# Patient Record
Sex: Male | Born: 1995 | Race: Black or African American | Hispanic: No | Marital: Single | Smoking: Current every day smoker
Health system: Southern US, Community
[De-identification: ages and names within clinical notes are randomized; demographics above are authoritative.]

## PROBLEM LIST (undated history)

## (undated) DIAGNOSIS — F32A Depression, unspecified: Secondary | ICD-10-CM

## (undated) DIAGNOSIS — F909 Attention-deficit hyperactivity disorder, unspecified type: Secondary | ICD-10-CM

## (undated) DIAGNOSIS — F419 Anxiety disorder, unspecified: Secondary | ICD-10-CM

## (undated) DIAGNOSIS — F329 Major depressive disorder, single episode, unspecified: Secondary | ICD-10-CM

## (undated) DIAGNOSIS — J45909 Unspecified asthma, uncomplicated: Secondary | ICD-10-CM

## (undated) DIAGNOSIS — F913 Oppositional defiant disorder: Secondary | ICD-10-CM

---
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
## 2016-04-21 ENCOUNTER — Encounter: Payer: Self-pay | Admitting: Neurology

## 2016-04-21 ENCOUNTER — Ambulatory Visit (INDEPENDENT_AMBULATORY_CARE_PROVIDER_SITE_OTHER): Payer: Medicaid Other | Admitting: Neurology

## 2016-04-21 VITALS — BP 107/77 | HR 79 | Ht 67.0 in | Wt 136.2 lb

## 2016-04-21 DIAGNOSIS — R519 Headache, unspecified: Secondary | ICD-10-CM | POA: Insufficient documentation

## 2016-04-21 DIAGNOSIS — G4489 Other headache syndrome: Secondary | ICD-10-CM

## 2016-04-21 DIAGNOSIS — M542 Cervicalgia: Secondary | ICD-10-CM | POA: Insufficient documentation

## 2016-04-21 DIAGNOSIS — M545 Low back pain, unspecified: Secondary | ICD-10-CM | POA: Insufficient documentation

## 2016-04-21 DIAGNOSIS — M5442 Lumbago with sciatica, left side: Secondary | ICD-10-CM

## 2016-04-21 DIAGNOSIS — G4486 Cervicogenic headache: Secondary | ICD-10-CM | POA: Insufficient documentation

## 2016-04-21 DIAGNOSIS — R51 Headache: Secondary | ICD-10-CM | POA: Diagnosis not present

## 2016-04-21 MED ORDER — AMITRIPTYLINE HCL 25 MG PO TABS
25.0000 mg | ORAL_TABLET | Freq: Every day | ORAL | Status: DC
Start: 2016-04-21 — End: 2016-10-02
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
## 2016-04-21 NOTE — Patient Instructions (Addendum)
Remember to drink plenty of fluid, eat healthy meals and do not skip any meals. Try to eat protein with a every meal and eat a healthy snack such as fruit or nuts in between meals. Try to keep a regular sleep-wake schedule and try to exercise daily, particularly in the form of walking, 20-30 minutes a day, if you can.   As far as your medications are concerned, I would like to suggest: Amitriptyline 25mg  at night. Start with 1/2 pill and increase to a whole pill. In 4-6 weeks if no improvement email and we can increase it to 50mg  at night.  As far as diagnostic testing: labs, physical therapy, headache journal  I would like to see you back in 3 months, sooner if we need to. Please call us with any interim questions, concerns, problems, updates or refill requests.   Our phone number is [phone]. We also have an after hours call service for urgent matters and there is a physician on-call for urgent questions. For any emergencies you know to call 911 or go to the nearest emergency room

## 2016-04-22 ENCOUNTER — Encounter: Payer: Self-pay | Admitting: Neurology

## 2016-05-19 ENCOUNTER — Ambulatory Visit: Payer: Medicaid Other | Attending: Neurology | Admitting: Rehabilitative and Restorative Service Providers"

## 2016-05-19 DIAGNOSIS — R293 Abnormal posture: Secondary | ICD-10-CM | POA: Diagnosis present

## 2016-05-19 DIAGNOSIS — M5442 Lumbago with sciatica, left side: Secondary | ICD-10-CM | POA: Insufficient documentation

## 2016-05-19 DIAGNOSIS — M542 Cervicalgia: Secondary | ICD-10-CM | POA: Insufficient documentation

## 2016-05-19 NOTE — Patient Instructions (Signed)
Bridge    Lie back, legs bent. Inhale, pressing hips up. Keeping ribs in, lengthen lower back. Exhale, rolling down along spine from top. Repeat _10___ times. Do __2-3__ sessions per day.  http://pm.exer.us/54   Copyright  VHI. All rights reserved.   Scapular Retraction (Prone)    Lie with arms at sides. Pinch shoulder blades together and raise arms a few inches from floor.  *Can lift shoulders off of floor. Repeat _10___ times per set. Do __2__ sets per session. Do __2__ sessions per day.  http://orth.exer.us/954   Copyright  VHI. All rights reserved.   Prone Plank (Eccentric)    On toes and elbows, pull abdomen in while stabilizing trunk. Slowly lower downward without arching back. __5_ reps per set, _2__ sets per day, __5_ days per week.  http://ecce.exer.us/242   Copyright  VHI. All rights reserved.  AROM: Lateral Neck Flexion    Slowly tilt head toward one shoulder, then the other. Hold each position __5__ seconds. Repeat ___5_ times per set. Do __1__ sets per session. Do __2__ sessions per day.  http://orth.exer.us/296   Copyright  VHI. All rights reserved.
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
## 2016-07-28 ENCOUNTER — Telehealth: Payer: Self-pay | Admitting: Adult Health

## 2016-07-28 ENCOUNTER — Other Ambulatory Visit: Payer: Self-pay

## 2016-07-28 ENCOUNTER — Ambulatory Visit: Payer: Medicaid Other | Admitting: Adult Health

## 2016-07-28 NOTE — Telephone Encounter (Signed)
Mom Neysa BonitoChristy called 1:19:45pm to advise, son is running late for 1:30pm appointment today w/NP, Megan. Mom advised, patient will still be seen up to 10 minutes past appointment time, after that will need to reschedule. Mom understands and agrees. Nurses Katrina and Victorino DikeJennifer advised.

## 2016-07-28 NOTE — Telephone Encounter (Signed)
Pt no-showed this afternoon's office visit. Called the same day as appt.

## 2016-07-29 ENCOUNTER — Encounter: Payer: Self-pay | Admitting: Adult Health

## 2016-10-02 ENCOUNTER — Encounter: Payer: Self-pay | Admitting: Adult Health

## 2016-10-02 ENCOUNTER — Ambulatory Visit (INDEPENDENT_AMBULATORY_CARE_PROVIDER_SITE_OTHER): Payer: Medicaid Other | Admitting: Adult Health

## 2016-10-02 VITALS — BP 118/77 | HR 86 | Wt 132.4 lb

## 2016-10-02 DIAGNOSIS — G44221 Chronic tension-type headache, intractable: Secondary | ICD-10-CM

## 2016-10-02 MED ORDER — AMITRIPTYLINE HCL 25 MG PO TABS: 25.0000 mg | ORAL_TABLET | Freq: Every day | ORAL | 11 refills | Status: DC

## 2016-10-02 NOTE — Patient Instructions (Signed)
Start Amitriptyline 1/2 tablet at bedtime for 1 week then increase to 1 tablet at bedtime If your symptoms worsen or you develop new symptoms please let us know.   Amitriptyline tablets What is this medicine? AMITRIPTYLINE (a mee TRIP ti leen) is used to treat depression. This medicine may be used for other purposes; ask your health care provider or pharmacist if you have questions. COMMON BRAND NAME(S): Elavil, Vanatrip What should I tell my health care provider before I take this medicine? They need to know if you have any of these conditions: -an alcohol problem -asthma, difficulty breathing -bipolar disorder or schizophrenia -difficulty passing urine, prostate trouble -glaucoma -heart disease or previous heart attack -liver disease -over active thyroid -seizures -thoughts or plans of suicide, a previous suicide attempt, or family history of suicide attempt -an unusual or allergic reaction to amitriptyline, other medicines, foods, dyes, or preservatives -pregnant or trying to get pregnant -breast-feeding How should I use this medicine? Take this medicine by mouth with a drink of water. Follow the directions on the prescription label. You can take the tablets with or without food. Take your medicine at regular intervals. Do not take it more often than directed. Do not stop taking this medicine suddenly except upon the advice of your doctor. Stopping this medicine too quickly may cause serious side effects or your condition may worsen. A special MedGuide will be given to you by the pharmacist with each prescription and refill. Be sure to read this information carefully each time. Talk to your pediatrician regarding the use of this medicine in children. Special care may be needed. Overdosage: If you think you have taken too much of this medicine contact a poison control center or emergency room at once. NOTE: This medicine is only for you. Do not share this medicine with others. What if  I miss a dose? If you miss a dose, take it as soon as you can. If it is almost time for your next dose, take only that dose. Do not take double or extra doses. What may interact with this medicine? Do not take this medicine with any of the following medications: -arsenic trioxide -certain medicines used to regulate abnormal heartbeat or to treat other heart conditions -cisapride -droperidol -halofantrine -linezolid -MAOIs like Carbex, Eldepryl, Marplan, Nardil, and Parnate -methylene blue -other medicines for mental depression -phenothiazines like perphenazine, thioridazine and chlorpromazine -pimozide -probucol -procarbazine -sparfloxacin -St. John's Wort -ziprasidone This medicine may also interact with the following medications: -atropine and related drugs like hyoscyamine, scopolamine, tolterodine and others -barbiturate medicines for inducing sleep or treating seizures, like phenobarbital -cimetidine -disulfiram -ethchlorvynol -thyroid hormones such as levothyroxine This list may not describe all possible interactions. Give your health care provider a list of all the medicines, herbs, non-prescription drugs, or dietary supplements you use. Also tell them if you smoke, drink alcohol, or use illegal drugs. Some items may interact with your medicine. What should I watch for while using this medicine? Tell your doctor if your symptoms do not get better or if they get worse. Visit your doctor or health care professional for regular checks on your progress. Because it may take several weeks to see the full effects of this medicine, it is important to continue your treatment as prescribed by your doctor. Patients and their families should watch out for new or worsening thoughts of suicide or depression. Also watch out for sudden changes in feelings such as feeling anxious, agitated, panicky, irritable, hostile, aggressive, impulsive, severely restless, overly excited  and hyperactive, or  not being able to sleep. If this happens, especially at the beginning of treatment or after a change in dose, call your health care professional. Bonita QuinYou may get drowsy or dizzy. Do not drive, use machinery, or do anything that needs mental alertness until you know how this medicine affects you. Do not stand or sit up quickly, especially if you are an older patient. This reduces the risk of dizzy or fainting spells. Alcohol may interfere with the effect of this medicine. Avoid alcoholic drinks. Do not treat yourself for coughs, colds, or allergies without asking your doctor or health care professional for advice. Some ingredients can increase possible side effects. Your mouth may get dry. Chewing sugarless gum or sucking hard candy, and drinking plenty of water will help. Contact your doctor if the problem does not go away or is severe. This medicine may cause dry eyes and blurred vision. If you wear contact lenses you may feel some discomfort. Lubricating drops may help. See your eye doctor if the problem does not go away or is severe. This medicine can cause constipation. Try to have a bowel movement at least every 2 to 3 days. If you do not have a bowel movement for 3 days, call your doctor or health care professional. This medicine can make you more sensitive to the sun. Keep out of the sun. If you cannot avoid being in the sun, wear protective clothing and use sunscreen. Do not use sun lamps or tanning beds/booths. What side effects may I notice from receiving this medicine? Side effects that you should report to your doctor or health care professional as soon as possible: -allergic reactions like skin rash, itching or hives, swelling of the face, lips, or tongue -anxious -breathing problems -changes in vision -confusion -elevated mood, decreased need for sleep, racing thoughts, impulsive behavior -eye pain -fast, irregular heartbeat -feeling faint or lightheaded, falls -feeling agitated, angry,  or irritable -fever with increased sweating -hallucination, loss of contact with reality -seizures -stiff muscles -suicidal thoughts or other mood changes -tingling, pain, or numbness in the feet or hands -trouble passing urine or change in the amount of urine -trouble sleeping -unusually weak or tired -vomiting -yellowing of the eyes or skin Side effects that usually do not require medical attention (report to your doctor or health care professional if they continue or are bothersome): -change in sex drive or performance -change in appetite or weight -constipation -dizziness -dry mouth -nausea -tired -tremors -upset stomach This list may not describe all possible side effects. Call your doctor for medical advice about side effects. You may report side effects to FDA at 1-800-FDA-1088. Where should I keep my medicine? Keep out of the reach of children. Store at room temperature between 20 and 25 degrees C (68 and 77 degrees F). Throw away any unused medicine after the expiration date. NOTE: This sheet is a summary. It may not cover all possible information. If you have questions about this medicine, talk to your doctor, pharmacist, or health care provider.  2017 Elsevier/Gold Standard (2016-03-21 12:14:15)
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
## 2016-10-10 ENCOUNTER — Encounter: Payer: Self-pay | Admitting: Rehabilitative and Restorative Service Providers"

## 2016-10-10 NOTE — Therapy (Signed)
[facility] [address] [place], [place], [address] Phone: [phone]   Fax:  [phone]  Patient Details  Name: LORIE MELICHAR MRN: [id number] Date of Birth: 05-18-1996 Referring Provider:  No ref. provider found  Encounter Date: last encounter 05/19/2016  PHYSICAL THERAPY DISCHARGE SUMMARY  Visits from Start of Care: 1  Current functional level related to goals / functional outcomes: See initial eval.   Remaining deficits: See initial eval--did not return   Education / Equipment: HEP.  Plan: Patient agrees to discharge.  Patient goals were not met. Patient is being discharged due to not returning since the last visit.  ?????        Thank you for the referral of this patient. Rudell Cobb, MPT   Verl Kitson 10/10/2016, 3:52 PM  [facility] [address] [place], [place], [address] Phone: [phone]   Fax:  [phone]

## 2016-10-17 NOTE — Progress Notes (Signed)
  Personally  participated in and made any corrections needed to history, physical, neuro exam,assessment and plan.   Quinisha Mould, MD [facility]  

## 2016-12-22 ENCOUNTER — Ambulatory Visit: Payer: Medicaid Other | Admitting: Adult Health

## 2016-12-23 ENCOUNTER — Encounter: Payer: Self-pay | Admitting: Adult Health

## 2017-02-18 ENCOUNTER — Ambulatory Visit (INDEPENDENT_AMBULATORY_CARE_PROVIDER_SITE_OTHER): Payer: Medicaid Other | Admitting: Adult Health

## 2017-02-18 ENCOUNTER — Encounter: Payer: Self-pay | Admitting: Adult Health

## 2017-02-18 ENCOUNTER — Encounter (INDEPENDENT_AMBULATORY_CARE_PROVIDER_SITE_OTHER): Payer: Self-pay

## 2017-02-18 VITALS — BP 123/72 | HR 67 | Wt 137.6 lb

## 2017-02-18 DIAGNOSIS — F4310 Post-traumatic stress disorder, unspecified: Secondary | ICD-10-CM

## 2017-02-18 DIAGNOSIS — G44209 Tension-type headache, unspecified, not intractable: Secondary | ICD-10-CM

## 2017-02-18 MED ORDER — AMITRIPTYLINE HCL 10 MG PO TABS: 30.0000 mg | ORAL_TABLET | Freq: Every day | ORAL | 11 refills | Status: DC

## 2017-02-18 NOTE — Patient Instructions (Addendum)
Increase Amitriptyline 30 mg at bedtime. Please Schedule appt with PCP to discuss possible PTSD If you have any thoughts of harming yourself please call 911 If your symptoms worsen or you develop new symptoms please let us know.   .Amitriptyline tablets What is this medicine? AMITRIPTYLINE (a mee TRIP ti leen) is used to treat depression. This medicine may be used for other purposes; ask your health care provider or pharmacist if you have questions. COMMON BRAND NAME(S): Elavil, Vanatrip What should I tell my health care provider before I take this medicine? They need to know if you have any of these conditions: -an alcohol problem -asthma, difficulty breathing -bipolar disorder or schizophrenia -difficulty passing urine, prostate trouble -glaucoma -heart disease or previous heart attack -liver disease -over active thyroid -seizures -thoughts or plans of suicide, a previous suicide attempt, or family history of suicide attempt -an unusual or allergic reaction to amitriptyline, other medicines, foods, dyes, or preservatives -pregnant or trying to get pregnant -breast-feeding How should I use this medicine? Take this medicine by mouth with a drink of water. Follow the directions on the prescription label. You can take the tablets with or without food. Take your medicine at regular intervals. Do not take it more often than directed. Do not stop taking this medicine suddenly except upon the advice of your doctor. Stopping this medicine too quickly may cause serious side effects or your condition may worsen. A special MedGuide will be given to you by the pharmacist with each prescription and refill. Be sure to read this information carefully each time. Talk to your pediatrician regarding the use of this medicine in children. Special care may be needed. Overdosage: If you think you have taken too much of this medicine contact a poison control center or emergency room at once. NOTE: This  medicine is only for you. Do not share this medicine with others. What if I miss a dose? If you miss a dose, take it as soon as you can. If it is almost time for your next dose, take only that dose. Do not take double or extra doses. What may interact with this medicine? Do not take this medicine with any of the following medications: -arsenic trioxide -certain medicines used to regulate abnormal heartbeat or to treat other heart conditions -cisapride -droperidol -halofantrine -linezolid -MAOIs like Carbex, Eldepryl, Marplan, Nardil, and Parnate -methylene blue -other medicines for mental depression -phenothiazines like perphenazine, thioridazine and chlorpromazine -pimozide -probucol -procarbazine -sparfloxacin -St. John's Wort -ziprasidone This medicine may also interact with the following medications: -atropine and related drugs like hyoscyamine, scopolamine, tolterodine and others -barbiturate medicines for inducing sleep or treating seizures, like phenobarbital -cimetidine -disulfiram -ethchlorvynol -thyroid hormones such as levothyroxine This list may not describe all possible interactions. Give your health care provider a list of all the medicines, herbs, non-prescription drugs, or dietary supplements you use. Also tell them if you smoke, drink alcohol, or use illegal drugs. Some items may interact with your medicine. What should I watch for while using this medicine? Tell your doctor if your symptoms do not get better or if they get worse. Visit your doctor or health care professional for regular checks on your progress. Because it may take several weeks to see the full effects of this medicine, it is important to continue your treatment as prescribed by your doctor. Patients and their families should watch out for new or worsening thoughts of suicide or depression. Also watch out for sudden changes in feelings such as feeling anxious,  agitated, panicky, irritable, hostile,  aggressive, impulsive, severely restless, overly excited and hyperactive, or not being able to sleep. If this happens, especially at the beginning of treatment or after a change in dose, call your health care professional. Bonita Quin may get drowsy or dizzy. Do not drive, use machinery, or do anything that needs mental alertness until you know how this medicine affects you. Do not stand or sit up quickly, especially if you are an older patient. This reduces the risk of dizzy or fainting spells. Alcohol may interfere with the effect of this medicine. Avoid alcoholic drinks. Do not treat yourself for coughs, colds, or allergies without asking your doctor or health care professional for advice. Some ingredients can increase possible side effects. Your mouth may get dry. Chewing sugarless gum or sucking hard candy, and drinking plenty of water will help. Contact your doctor if the problem does not go away or is severe. This medicine may cause dry eyes and blurred vision. If you wear contact lenses you may feel some discomfort. Lubricating drops may help. See your eye doctor if the problem does not go away or is severe. This medicine can cause constipation. Try to have a bowel movement at least every 2 to 3 days. If you do not have a bowel movement for 3 days, call your doctor or health care professional. This medicine can make you more sensitive to the sun. Keep out of the sun. If you cannot avoid being in the sun, wear protective clothing and use sunscreen. Do not use sun lamps or tanning beds/booths. What side effects may I notice from receiving this medicine? Side effects that you should report to your doctor or health care professional as soon as possible: -allergic reactions like skin rash, itching or hives, swelling of the face, lips, or tongue -anxious -breathing problems -changes in vision -confusion -elevated mood, decreased need for sleep, racing thoughts, impulsive behavior -eye pain -fast, irregular  heartbeat -feeling faint or lightheaded, falls -feeling agitated, angry, or irritable -fever with increased sweating -hallucination, loss of contact with reality -seizures -stiff muscles -suicidal thoughts or other mood changes -tingling, pain, or numbness in the feet or hands -trouble passing urine or change in the amount of urine -trouble sleeping -unusually weak or tired -vomiting -yellowing of the eyes or skin Side effects that usually do not require medical attention (report to your doctor or health care professional if they continue or are bothersome): -change in sex drive or performance -change in appetite or weight -constipation -dizziness -dry mouth -nausea -tired -tremors -upset stomach This list may not describe all possible side effects. Call your doctor for medical advice about side effects. You may report side effects to FDA at 1-800-FDA-1088. Where should I keep my medicine? Keep out of the reach of children. Store at room temperature between 20 and 25 degrees C (68 and 77 degrees F). Throw away any unused medicine after the expiration date. NOTE: This sheet is a summary. It may not cover all possible information. If you have questions about this medicine, talk to your doctor, pharmacist, or health care provider.  2018 Elsevier/Gold Standard (2016-03-21 12:14:15)
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
## 2017-03-12 NOTE — Progress Notes (Signed)
Personally  participated in, made any corrections needed, and agree with history, physical, neuro exam,assessment and plan as stated above.    Nemesio Castrillon, MD [facility] 

## 2017-08-24 ENCOUNTER — Telehealth: Payer: Self-pay | Admitting: *Deleted

## 2017-08-24 ENCOUNTER — Ambulatory Visit: Payer: Medicaid Other | Admitting: Neurology

## 2017-08-24 NOTE — Telephone Encounter (Signed)
Patient no showed appt on 08/24/2017 @ 14:30

## 2017-08-25 ENCOUNTER — Encounter: Payer: Self-pay | Admitting: Neurology

## 2017-09-25 ENCOUNTER — Encounter (HOSPITAL_COMMUNITY): Payer: Self-pay | Admitting: Emergency Medicine

## 2017-09-25 ENCOUNTER — Ambulatory Visit (HOSPITAL_COMMUNITY)
Admission: EM | Admit: 2017-09-25 | Discharge: 2017-09-25 | Disposition: A | Discharge status: Home / Self Care | Payer: Medicaid Other | Attending: Internal Medicine | Admitting: Internal Medicine

## 2017-09-25 DIAGNOSIS — R112 Nausea with vomiting, unspecified: Secondary | ICD-10-CM | POA: Diagnosis not present

## 2017-09-25 DIAGNOSIS — Z7689 Persons encountering health services in other specified circumstances: Secondary | ICD-10-CM | POA: Diagnosis not present

## 2017-09-25 DIAGNOSIS — Z0289 Encounter for other administrative examinations: Secondary | ICD-10-CM | POA: Diagnosis not present
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
## 2017-09-25 NOTE — ED Triage Notes (Signed)
Pt here for note to return to work after having GI sx x 2 days that are now resolved

## 2017-09-25 NOTE — Discharge Instructions (Signed)
Given your history and exam, ok to return to work. Still keep up good hand hygiene.

## 2019-12-07 ENCOUNTER — Ambulatory Visit (HOSPITAL_COMMUNITY)
Admission: EM | Admit: 2019-12-07 | Discharge: 2019-12-07 | Disposition: A | Discharge status: Home / Self Care | Payer: Self-pay | Attending: Family Medicine | Admitting: Family Medicine

## 2019-12-07 ENCOUNTER — Other Ambulatory Visit: Payer: Self-pay

## 2019-12-07 ENCOUNTER — Encounter (HOSPITAL_COMMUNITY): Payer: Self-pay | Admitting: Emergency Medicine

## 2019-12-07 DIAGNOSIS — Z202 Contact with and (suspected) exposure to infections with a predominantly sexual mode of transmission: Secondary | ICD-10-CM

## 2019-12-07 DIAGNOSIS — R030 Elevated blood-pressure reading, without diagnosis of hypertension: Secondary | ICD-10-CM | POA: Insufficient documentation

## 2019-12-07 DIAGNOSIS — R369 Urethral discharge, unspecified: Secondary | ICD-10-CM | POA: Insufficient documentation

## 2019-12-07 MED ORDER — CEFTRIAXONE SODIUM 500 MG IJ SOLR
500.0000 mg | Freq: Once | INTRAMUSCULAR | Status: AC
  Administered 2019-12-07: 500 mg via INTRAMUSCULAR

## 2019-12-07 MED ORDER — CEFTRIAXONE SODIUM 500 MG IJ SOLR
INTRAMUSCULAR | Status: AC
  Filled 2019-12-07: qty 500

## 2019-12-07 MED ORDER — DOXYCYCLINE HYCLATE 100 MG PO CAPS: 100.0000 mg | ORAL_CAPSULE | Freq: Two times a day (BID) | ORAL | 0 refills | Status: DC

## 2019-12-07 NOTE — Discharge Instructions (Addendum)
You have been given the following today for treatment of suspected gonorrhea and/or chlamydia:  cefTRIAXone (ROCEPHIN) injection 500 mg  Please pick up your prescription for doxycycline 100 mg and begin taking twice daily for the next seven (7) days.  Even though we have treated you today, we have sent testing for sexually transmitted infections. We will notify you of any positive results once they are received. If required, we will prescribe any medications you might need.  Please refrain from all sexual activity for at least the next seven days.  Your blood pressure was noted to be elevated during your visit today. You may return here within the next few days to recheck if unable to see your primary care doctor. If your blood pressure remains persistently elevated, you may need to begin taking a medication.  BP (!) 147/97 (BP Location: Left Arm)   Pulse 71   Temp 98.9 F (37.2 C) (Oral)   Resp 12   SpO2 100%

## 2019-12-07 NOTE — ED Triage Notes (Signed)
Pt reports penile discharge x1 week along with a report that he was exposed to gonorrhea and chlamydia.
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
## 2019-12-08 ENCOUNTER — Telehealth (HOSPITAL_COMMUNITY): Payer: Self-pay | Admitting: Emergency Medicine

## 2019-12-08 LAB — CYTOLOGY, (ORAL, ANAL, URETHRAL) ANCILLARY ONLY
Chlamydia: POSITIVE — AB
Neisseria Gonorrhea: POSITIVE — AB
Trichomonas: NEGATIVE

## 2019-12-08 NOTE — Telephone Encounter (Signed)
Chlamydia is positive.  This was treated at the urgent care visit with doxycycline  Please refrain from sexual intercourse for 7 days to give the medicine time to work.  Sexual partners need to be notified and tested/treated.  Condoms may reduce risk of reinfection.  Recheck or followup with PCP for further evaluation if symptoms are not improving.  [facility] notified.  Test for gonorrhea was positive. This was treated at the urgent care visit with IM rocephin 500mg . Please refrain from sexual intercourse for 7 days after treatment to give the medicine time to work. Sexual partners need to be notified and tested/treated. Condoms may reduce risk of reinfection. Recheck or followup with PCP for further evaluation if symptoms are not improving. [facility] notified.   Attempted to reach patient. No answer at this time. Voicemail left.

## 2019-12-12 ENCOUNTER — Telehealth (HOSPITAL_COMMUNITY): Payer: Self-pay | Admitting: Emergency Medicine

## 2019-12-12 NOTE — Telephone Encounter (Signed)
Patient contacted by phone and made aware of  cytology  results. Pt verbalized understanding and had all questions answered.    

## 2023-05-11 ENCOUNTER — Ambulatory Visit (INDEPENDENT_AMBULATORY_CARE_PROVIDER_SITE_OTHER): Payer: Self-pay

## 2023-05-11 ENCOUNTER — Ambulatory Visit (HOSPITAL_COMMUNITY)
Admission: EM | Admit: 2023-05-11 | Discharge: 2023-05-11 | Disposition: A | Discharge status: Home / Self Care | Payer: Self-pay | Attending: Nurse Practitioner | Admitting: Nurse Practitioner

## 2023-05-11 ENCOUNTER — Encounter (HOSPITAL_COMMUNITY): Payer: Self-pay | Admitting: *Deleted

## 2023-05-11 ENCOUNTER — Other Ambulatory Visit: Payer: Self-pay

## 2023-05-11 DIAGNOSIS — S63641A Sprain of metacarpophalangeal joint of right thumb, initial encounter: Secondary | ICD-10-CM

## 2023-05-11 MED ORDER — IBUPROFEN 800 MG PO TABS: 800.0000 mg | ORAL_TABLET | Freq: Three times a day (TID) | ORAL | 0 refills | Status: AC | PRN

## 2023-05-11 NOTE — ED Triage Notes (Signed)
Pt reports he hyperextended his RT thumb last night. Pt reports pain and swelling to Thumb.
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
## 2023-05-11 NOTE — Discharge Instructions (Addendum)
The x-ray of your thumb today does not show any broken bones.  Please wear the splint for the next couple of days to help protect your thumb.  Please also apply ice 15 minutes on, 45 minutes off every hour while awake.  You can take Tylenol 500 to 1000 mg every 6 hours alternating with ibuprofen 800 mg every 8 hours as needed for pain.  Seek care for persistent/worsening symptoms despite treatment.

## 2023-05-18 ENCOUNTER — Encounter (HOSPITAL_COMMUNITY): Payer: Self-pay

## 2023-05-18 ENCOUNTER — Ambulatory Visit (HOSPITAL_COMMUNITY)
Admission: EM | Admit: 2023-05-18 | Discharge: 2023-05-18 | Disposition: A | Discharge status: Home / Self Care | Payer: Self-pay | Attending: Nurse Practitioner | Admitting: Nurse Practitioner

## 2023-05-18 DIAGNOSIS — G8929 Other chronic pain: Secondary | ICD-10-CM

## 2023-05-18 DIAGNOSIS — M5441 Lumbago with sciatica, right side: Secondary | ICD-10-CM

## 2023-05-18 MED ORDER — KETOROLAC TROMETHAMINE 30 MG/ML IJ SOLN
INTRAMUSCULAR | Status: AC
  Filled 2023-05-18: qty 1

## 2023-05-18 MED ORDER — TIZANIDINE HCL 4 MG PO TABS: 4.0000 mg | ORAL_TABLET | Freq: Three times a day (TID) | ORAL | 0 refills | Status: AC | PRN

## 2023-05-18 MED ORDER — KETOROLAC TROMETHAMINE 30 MG/ML IJ SOLN
30.0000 mg | Freq: Once | INTRAMUSCULAR | Status: AC
  Administered 2023-05-18: 30 mg via INTRAMUSCULAR
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
## 2023-05-18 NOTE — Discharge Instructions (Addendum)
We have given you an injection of Toradol today to help with pain.  You can take Tylenol 500 to 1000 mg every 6 hours as needed for pain.  Start the muscle relaxant called tizanidine to help relax the muscles in your back.  Also start doing the sciatic rehab exercises that are in this packet.  Recommend follow-up with sports medicine since this is a chronic pain that you are having.

## 2024-02-25 ENCOUNTER — Ambulatory Visit: Payer: Self-pay

## 2024-02-26 ENCOUNTER — Telehealth: Payer: Self-pay

## 2024-02-26 ENCOUNTER — Other Ambulatory Visit: Payer: Self-pay

## 2024-02-26 NOTE — Telephone Encounter (Signed)
 I called the patient to remind him of his lab appointment at 10 am today to have his blood drawn for [employer]. (Partner of Dalbert Dubois DOB 08/09/2003). Without his blood sample, the lab will only be able to run the test as duo (fetal and maternal samples).  Georgean Kindle, MS, [facility] Certified Genetic Counselor [facility] [phone]

## 2024-02-29 ENCOUNTER — Ambulatory Visit: Payer: Self-pay | Attending: Obstetrics and Gynecology

## 2024-03-01 ENCOUNTER — Other Ambulatory Visit: Payer: Self-pay
